# Patient Record
Sex: Female | Born: 1945 | Race: White | Hispanic: No | Smoking: Never smoker
Health system: Southern US, Community
[De-identification: ages and names within clinical notes are randomized; demographics above are authoritative.]

## PROBLEM LIST (undated history)

## (undated) ENCOUNTER — Ambulatory Visit: Admission: EM | Payer: Medicare PPO | Source: Home / Self Care

## (undated) DIAGNOSIS — M479 Spondylosis, unspecified: Secondary | ICD-10-CM

## (undated) DIAGNOSIS — J45909 Unspecified asthma, uncomplicated: Secondary | ICD-10-CM

## (undated) DIAGNOSIS — D509 Iron deficiency anemia, unspecified: Secondary | ICD-10-CM

## (undated) DIAGNOSIS — D649 Anemia, unspecified: Secondary | ICD-10-CM

## (undated) DIAGNOSIS — D126 Benign neoplasm of colon, unspecified: Secondary | ICD-10-CM

## (undated) DIAGNOSIS — E785 Hyperlipidemia, unspecified: Secondary | ICD-10-CM

## (undated) DIAGNOSIS — C801 Malignant (primary) neoplasm, unspecified: Secondary | ICD-10-CM

## (undated) HISTORY — PX: BACK SURGERY: SHX140

## (undated) HISTORY — PX: CATARACT EXTRACTION, BILATERAL: SHX1313

## (undated) HISTORY — PX: TUBAL LIGATION: SHX77

## (undated) HISTORY — PX: THUMB ARTHROSCOPY: SHX2509

## (undated) HISTORY — PX: EYE SURGERY: SHX253

---

## 2004-12-04 ENCOUNTER — Ambulatory Visit: Payer: Self-pay

## 2005-12-27 ENCOUNTER — Ambulatory Visit: Payer: Self-pay

## 2006-01-02 ENCOUNTER — Ambulatory Visit: Payer: Self-pay

## 2006-06-21 ENCOUNTER — Ambulatory Visit: Payer: Self-pay

## 2006-07-08 ENCOUNTER — Ambulatory Visit: Payer: Self-pay | Admitting: Gastroenterology

## 2006-08-13 ENCOUNTER — Ambulatory Visit: Payer: Self-pay | Admitting: Unknown Physician Specialty

## 2006-12-24 ENCOUNTER — Ambulatory Visit: Payer: Self-pay

## 2008-01-01 ENCOUNTER — Ambulatory Visit: Payer: Self-pay

## 2009-01-24 ENCOUNTER — Ambulatory Visit: Payer: Self-pay

## 2009-02-07 ENCOUNTER — Ambulatory Visit: Payer: Self-pay

## 2009-03-28 ENCOUNTER — Ambulatory Visit: Payer: Self-pay | Admitting: Family Medicine

## 2009-07-23 ENCOUNTER — Ambulatory Visit: Payer: Self-pay | Admitting: Internal Medicine

## 2010-02-07 ENCOUNTER — Ambulatory Visit: Payer: Self-pay

## 2011-03-13 ENCOUNTER — Ambulatory Visit: Payer: Self-pay

## 2012-01-14 ENCOUNTER — Ambulatory Visit: Payer: Self-pay | Admitting: Gastroenterology

## 2012-01-15 LAB — PATHOLOGY REPORT

## 2014-10-18 ENCOUNTER — Encounter (INDEPENDENT_AMBULATORY_CARE_PROVIDER_SITE_OTHER): Payer: Medicare (Managed Care) | Admitting: Ophthalmology

## 2014-10-18 DIAGNOSIS — H43813 Vitreous degeneration, bilateral: Secondary | ICD-10-CM | POA: Diagnosis not present

## 2014-10-18 DIAGNOSIS — H33301 Unspecified retinal break, right eye: Secondary | ICD-10-CM

## 2014-10-27 ENCOUNTER — Encounter (INDEPENDENT_AMBULATORY_CARE_PROVIDER_SITE_OTHER): Payer: Self-pay | Admitting: Ophthalmology

## 2014-11-03 ENCOUNTER — Ambulatory Visit (INDEPENDENT_AMBULATORY_CARE_PROVIDER_SITE_OTHER): Payer: Medicare (Managed Care) | Admitting: Ophthalmology

## 2014-11-03 DIAGNOSIS — H33301 Unspecified retinal break, right eye: Secondary | ICD-10-CM

## 2014-11-08 ENCOUNTER — Encounter (INDEPENDENT_AMBULATORY_CARE_PROVIDER_SITE_OTHER): Payer: Medicare (Managed Care) | Admitting: Ophthalmology

## 2014-11-08 DIAGNOSIS — H33301 Unspecified retinal break, right eye: Secondary | ICD-10-CM

## 2014-11-11 ENCOUNTER — Encounter (INDEPENDENT_AMBULATORY_CARE_PROVIDER_SITE_OTHER): Payer: Medicare (Managed Care) | Admitting: Ophthalmology

## 2015-03-07 ENCOUNTER — Ambulatory Visit (INDEPENDENT_AMBULATORY_CARE_PROVIDER_SITE_OTHER): Payer: Medicare (Managed Care) | Admitting: Ophthalmology

## 2015-03-07 DIAGNOSIS — H43813 Vitreous degeneration, bilateral: Secondary | ICD-10-CM

## 2015-03-07 DIAGNOSIS — H33301 Unspecified retinal break, right eye: Secondary | ICD-10-CM | POA: Diagnosis not present

## 2016-03-20 ENCOUNTER — Other Ambulatory Visit: Payer: Self-pay | Admitting: Family Medicine

## 2016-03-20 ENCOUNTER — Other Ambulatory Visit: Payer: Self-pay | Admitting: Certified Nurse Midwife

## 2016-03-20 DIAGNOSIS — Z1231 Encounter for screening mammogram for malignant neoplasm of breast: Secondary | ICD-10-CM

## 2016-04-03 ENCOUNTER — Encounter: Payer: Self-pay | Admitting: Radiology

## 2016-04-03 ENCOUNTER — Ambulatory Visit
Admission: RE | Admit: 2016-04-03 | Discharge: 2016-04-03 | Disposition: A | Discharge status: Home / Self Care | Payer: Medicare Other | Source: Ambulatory Visit | Attending: Family Medicine | Admitting: Family Medicine

## 2016-04-03 DIAGNOSIS — Z1231 Encounter for screening mammogram for malignant neoplasm of breast: Secondary | ICD-10-CM | POA: Insufficient documentation

## 2016-04-03 DIAGNOSIS — R928 Other abnormal and inconclusive findings on diagnostic imaging of breast: Secondary | ICD-10-CM | POA: Insufficient documentation

## 2016-04-03 HISTORY — DX: Malignant (primary) neoplasm, unspecified: C80.1

## 2016-04-06 ENCOUNTER — Inpatient Hospital Stay
Admission: RE | Admit: 2016-04-06 | Discharge: 2016-04-06 | Disposition: A | Discharge status: Home / Self Care | Payer: Self-pay | Source: Ambulatory Visit | Attending: *Deleted | Admitting: *Deleted

## 2016-04-06 ENCOUNTER — Other Ambulatory Visit: Payer: Self-pay | Admitting: *Deleted

## 2016-04-06 DIAGNOSIS — Z9289 Personal history of other medical treatment: Secondary | ICD-10-CM

## 2016-04-11 ENCOUNTER — Other Ambulatory Visit: Payer: Self-pay | Admitting: Family Medicine

## 2016-04-11 DIAGNOSIS — N6320 Unspecified lump in the left breast, unspecified quadrant: Secondary | ICD-10-CM

## 2016-04-17 ENCOUNTER — Ambulatory Visit
Admission: RE | Admit: 2016-04-17 | Discharge: 2016-04-17 | Disposition: A | Discharge status: Home / Self Care | Payer: Medicare Other | Source: Ambulatory Visit | Attending: Family Medicine | Admitting: Family Medicine

## 2016-04-17 DIAGNOSIS — N6320 Unspecified lump in the left breast, unspecified quadrant: Secondary | ICD-10-CM

## 2016-04-24 ENCOUNTER — Other Ambulatory Visit: Payer: Self-pay | Admitting: Family Medicine

## 2016-04-24 DIAGNOSIS — I609 Nontraumatic subarachnoid hemorrhage, unspecified: Secondary | ICD-10-CM

## 2016-04-26 ENCOUNTER — Ambulatory Visit: Payer: Medicare Other

## 2016-04-26 ENCOUNTER — Other Ambulatory Visit: Payer: Medicare Other

## 2016-05-14 ENCOUNTER — Ambulatory Visit
Admission: RE | Admit: 2016-05-14 | Discharge: 2016-05-14 | Disposition: A | Discharge status: Home / Self Care | Payer: Medicare Other | Source: Ambulatory Visit | Attending: Family Medicine | Admitting: Family Medicine

## 2016-05-14 DIAGNOSIS — Z09 Encounter for follow-up examination after completed treatment for conditions other than malignant neoplasm: Secondary | ICD-10-CM | POA: Insufficient documentation

## 2016-05-14 DIAGNOSIS — Z8679 Personal history of other diseases of the circulatory system: Secondary | ICD-10-CM | POA: Diagnosis not present

## 2016-05-14 DIAGNOSIS — I609 Nontraumatic subarachnoid hemorrhage, unspecified: Secondary | ICD-10-CM

## 2017-03-20 ENCOUNTER — Ambulatory Visit
Admission: EM | Admit: 2017-03-20 | Discharge: 2017-03-20 | Disposition: A | Discharge status: Home / Self Care | Payer: Medicare Other | Attending: Family Medicine | Admitting: Family Medicine

## 2017-03-20 DIAGNOSIS — A09 Infectious gastroenteritis and colitis, unspecified: Secondary | ICD-10-CM

## 2017-03-20 HISTORY — DX: Hyperlipidemia, unspecified: E78.5

## 2017-03-20 LAB — CBC WITH DIFFERENTIAL/PLATELET
Basophils Absolute: 0 10*3/uL (ref 0–0.1)
Basophils Relative: 1 %
EOS ABS: 0.1 10*3/uL (ref 0–0.7)
EOS PCT: 2 %
HCT: 35.4 % (ref 35.0–47.0)
Hemoglobin: 12 g/dL (ref 12.0–16.0)
LYMPHS PCT: 40 %
Lymphs Abs: 1.4 10*3/uL (ref 1.0–3.6)
MCH: 28.8 pg (ref 26.0–34.0)
MCHC: 34 g/dL (ref 32.0–36.0)
MCV: 84.7 fL (ref 80.0–100.0)
Monocytes Absolute: 0.4 10*3/uL (ref 0.2–0.9)
Monocytes Relative: 12 %
Neutro Abs: 1.7 10*3/uL (ref 1.4–6.5)
Neutrophils Relative %: 47 %
PLATELETS: 212 10*3/uL (ref 150–440)
RBC: 4.18 MIL/uL (ref 3.80–5.20)
RDW: 13.9 % (ref 11.5–14.5)
WBC: 3.7 10*3/uL (ref 3.6–11.0)

## 2017-03-20 LAB — COMPREHENSIVE METABOLIC PANEL
ALK PHOS: 52 U/L (ref 38–126)
ALT: 17 U/L (ref 14–54)
AST: 22 U/L (ref 15–41)
Albumin: 4 g/dL (ref 3.5–5.0)
Anion gap: 7 (ref 5–15)
BILIRUBIN TOTAL: 0.7 mg/dL (ref 0.3–1.2)
CO2: 26 mmol/L (ref 22–32)
Calcium: 8.9 mg/dL (ref 8.9–10.3)
Chloride: 105 mmol/L (ref 101–111)
Creatinine, Ser: 0.67 mg/dL (ref 0.44–1.00)
Glucose, Bld: 118 mg/dL — ABNORMAL HIGH (ref 65–99)
POTASSIUM: 3.6 mmol/L (ref 3.5–5.1)
Sodium: 138 mmol/L (ref 135–145)
TOTAL PROTEIN: 7 g/dL (ref 6.5–8.1)

## 2017-03-20 MED ORDER — VANCOMYCIN HCL 125 MG PO CAPS: 125.0000 mg | ORAL_CAPSULE | Freq: Four times a day (QID) | ORAL | 0 refills | Status: DC

## 2017-03-20 NOTE — ED Triage Notes (Signed)
Patient complains of diarrhea after 2 rounds of antibiotics. Patient states that she was originally placed on Bactrim for 7 days for a puncture wound and then was placed on Clindamycin for additional 10 days. Patient states that her stools over the last 3-4 days ago. Patient states that she has had loose stools that are waking her up at night. Patient states that everytime she urinates she will also have diarrhea. Patient states that she has currently already went 4 times today. Patient states that she believes she is having greater than 10 BMs daily. Patient reports that she is still able to eat and keep things down.

## 2017-03-20 NOTE — ED Provider Notes (Signed)
[facility]    CSN: [id number] Arrival date & time: 03/20/17  2025     History   Chief Complaint Chief Complaint  Patient presents with  . Diarrhea    HPI Holly Soto is a 71 y.o. female.   71 yo female with a c/o mucousy diarrhea for the past 3-4 days with about 10 bowel movements per day. Denies abdominal pain, cramping, fevers, chills, melena, hematochezia, vomiting. States symptoms began after finishing 2 rounds of antibiotics (Bactrim and Clindamycin) for a puncture wound.    The history is provided by the patient.  Diarrhea    Past Medical History:  Diagnosis Date  . Cancer ([place])    skin ca on back  . Hyperlipidemia     There are no active problems to display for this patient.   Past Surgical History:  Procedure Laterality Date  . BACK SURGERY    . CATARACT EXTRACTION, BILATERAL    . THUMB ARTHROSCOPY Right     OB History    No data available       Home Medications    Prior to Admission medications   Medication Sig Start Date End Date Taking? Authorizing Provider  atorvastatin (LIPITOR) 10 MG tablet Take 10 mg by mouth daily.   Yes [provider]  Calcium Carb-Cholecalciferol (CALTRATE 600+D) 600-800 MG-UNIT TABS Take by mouth.   Yes [provider]  cholecalciferol (VITAMIN D) 1000 units tablet Take 1,000 Units by mouth daily.   Yes [provider]  Multiple Vitamin (MULTIVITAMIN) capsule Take 1 capsule by mouth daily.   Yes [provider]  Multiple Vitamins-Minerals (PRESERVISION AREDS 2+MULTI VIT PO) Take by mouth.   Yes [provider]  vancomycin (VANCOCIN) 125 MG capsule Take 1 capsule (125 mg total) by mouth 4 (four) times daily. 03/20/17   Norval Gable, MD    Family History Family History  Problem Relation Age of Onset  . Breast cancer Sister 32    Social History Social History  Substance Use Topics  . Smoking status: Never Smoker  . Smokeless tobacco: Never Used  .  Alcohol use No     Allergies   Sulfa antibiotics   Review of Systems Review of Systems  Gastrointestinal: Positive for diarrhea.     Physical Exam Triage Vital Signs ED Triage Vitals  Enc Vitals Group     BP 03/20/17 0844 (!) 144/64     Pulse Rate 03/20/17 0844 85     Resp 03/20/17 0844 18     Temp 03/20/17 0844 98.2 F (36.8 C)     Temp Source 03/20/17 0844 Oral     SpO2 03/20/17 0844 100 %     Weight 03/20/17 0842 148 lb (67.1 kg)     Height 03/20/17 0842 5' 5.5" (1.664 m)     Head Circumference --      Peak Flow --      Pain Score 03/20/17 0842 3     Pain Loc --      Pain Edu? --      Excl. in [place]? --    No data found.   Updated Vital Signs BP (!) 144/64 (BP Location: Left Arm)   Pulse 85   Temp 98.2 F (36.8 C) (Oral)   Resp 18   Ht 5' 5.5" (1.664 m)   Wt 148 lb (67.1 kg)   SpO2 100%   BMI 24.25 kg/m   Visual Acuity Right Eye Distance:   Left Eye Distance:  Bilateral Distance:    Right Eye Near:   Left Eye Near:    Bilateral Near:     Physical Exam  Constitutional: She appears well-developed and well-nourished. No distress.  Abdominal: Soft. Bowel sounds are normal. She exhibits no distension and no mass. There is no tenderness. There is no rebound and no guarding.  Skin: She is not diaphoretic.  Nursing note and vitals reviewed.    UC Treatments / Results  Labs (all labs ordered are listed, but only abnormal results are displayed) Labs Reviewed  COMPREHENSIVE METABOLIC PANEL - Abnormal; Notable for the following:       Result Value   Glucose, Bld 118 (*)    BUN <5 (*)    All other components within normal limits  CBC WITH DIFFERENTIAL/PLATELET    EKG  EKG Interpretation None       Radiology No results found.  Procedures Procedures (including critical care time)  Medications Ordered in UC Medications - No data to display   Initial Impression / Assessment and Plan / UC Course  I have reviewed the triage vital signs and  the nursing notes.  Pertinent labs & imaging results that were available during my care of the patient were reviewed by me and considered in my medical decision making (see chart for details).       Final Clinical Impressions(s) / UC Diagnoses   Final diagnoses:  Diarrhea of infectious origin  (possible/likely C. Diff)   New Prescriptions Discharge Medication List as of 03/20/2017  9:53 AM    START taking these medications   Details  vancomycin (VANCOCIN) 125 MG capsule Take 1 capsule (125 mg total) by mouth 4 (four) times daily., Starting Wed 03/20/2017, Normal       1. Lab results and diagnosis reviewed with patient 2. rx as per orders above; reviewed possible side effects, interactions, risks and benefits  3. Recommend supportive treatment with increased fluids/clear liquids, then advance diet slowly as tolerated 4. Follow-up prn if symptoms worsen or don't improve   Controlled Substance Prescriptions [place] Controlled Substance Registry consulted? Not Applicable   Norval Gable, MD 03/20/17 [phone]

## 2017-04-08 ENCOUNTER — Other Ambulatory Visit: Payer: Self-pay | Admitting: Pediatrics

## 2017-04-08 DIAGNOSIS — Z1231 Encounter for screening mammogram for malignant neoplasm of breast: Secondary | ICD-10-CM

## 2017-06-11 ENCOUNTER — Ambulatory Visit
Admission: RE | Admit: 2017-06-11 | Discharge: 2017-06-11 | Disposition: A | Discharge status: Home / Self Care | Payer: Medicare Other | Source: Ambulatory Visit | Attending: Pediatrics | Admitting: Pediatrics

## 2017-06-11 DIAGNOSIS — Z1231 Encounter for screening mammogram for malignant neoplasm of breast: Secondary | ICD-10-CM

## 2017-06-18 ENCOUNTER — Ambulatory Visit: Admission: RE | Admit: 2017-06-18 | Payer: Medicare Other | Source: Ambulatory Visit | Admitting: Gastroenterology

## 2017-06-18 ENCOUNTER — Encounter: Admission: RE | Payer: Self-pay | Source: Ambulatory Visit

## 2017-06-18 SURGERY — COLONOSCOPY WITH PROPOFOL
Anesthesia: General

## 2017-08-28 ENCOUNTER — Encounter: Payer: Self-pay | Admitting: *Deleted

## 2017-08-29 ENCOUNTER — Encounter
Admission: RE | Disposition: A | Discharge status: Home / Self Care | Payer: Self-pay | Source: Ambulatory Visit | Attending: Gastroenterology

## 2017-08-29 ENCOUNTER — Ambulatory Visit: Payer: Medicare Other | Admitting: Certified Registered"

## 2017-08-29 ENCOUNTER — Ambulatory Visit
Admission: RE | Admit: 2017-08-29 | Discharge: 2017-08-29 | Disposition: A | Discharge status: Home / Self Care | Payer: Medicare Other | Source: Ambulatory Visit | Attending: Gastroenterology | Admitting: Gastroenterology

## 2017-08-29 ENCOUNTER — Encounter: Payer: Self-pay | Admitting: *Deleted

## 2017-08-29 DIAGNOSIS — Z86010 Personal history of colonic polyps: Secondary | ICD-10-CM | POA: Diagnosis not present

## 2017-08-29 DIAGNOSIS — D128 Benign neoplasm of rectum: Secondary | ICD-10-CM | POA: Diagnosis not present

## 2017-08-29 DIAGNOSIS — Z882 Allergy status to sulfonamides status: Secondary | ICD-10-CM | POA: Diagnosis not present

## 2017-08-29 DIAGNOSIS — D124 Benign neoplasm of descending colon: Secondary | ICD-10-CM | POA: Insufficient documentation

## 2017-08-29 DIAGNOSIS — Z1211 Encounter for screening for malignant neoplasm of colon: Secondary | ICD-10-CM | POA: Insufficient documentation

## 2017-08-29 DIAGNOSIS — D125 Benign neoplasm of sigmoid colon: Secondary | ICD-10-CM | POA: Diagnosis not present

## 2017-08-29 DIAGNOSIS — K5730 Diverticulosis of large intestine without perforation or abscess without bleeding: Secondary | ICD-10-CM | POA: Insufficient documentation

## 2017-08-29 DIAGNOSIS — Z7982 Long term (current) use of aspirin: Secondary | ICD-10-CM | POA: Insufficient documentation

## 2017-08-29 DIAGNOSIS — Z79899 Other long term (current) drug therapy: Secondary | ICD-10-CM | POA: Diagnosis not present

## 2017-08-29 DIAGNOSIS — Z881 Allergy status to other antibiotic agents status: Secondary | ICD-10-CM | POA: Diagnosis not present

## 2017-08-29 HISTORY — DX: Anemia, unspecified: D64.9

## 2017-08-29 HISTORY — DX: Unspecified asthma, uncomplicated: J45.909

## 2017-08-29 HISTORY — PX: COLONOSCOPY WITH PROPOFOL: SHX5780

## 2017-08-29 HISTORY — DX: Spondylosis, unspecified: M47.9

## 2017-08-29 SURGERY — COLONOSCOPY WITH PROPOFOL
Anesthesia: General

## 2017-08-29 MED ORDER — LIDOCAINE HCL (PF) 2 % IJ SOLN
INTRAMUSCULAR | Status: AC
  Filled 2017-08-29: qty 10

## 2017-08-29 MED ORDER — PROPOFOL 10 MG/ML IV BOLUS
INTRAVENOUS | Status: AC
  Filled 2017-08-29: qty 20

## 2017-08-29 MED ORDER — SODIUM CHLORIDE 0.9 % IV SOLN
INTRAVENOUS | Status: DC
  Administered 2017-08-29: 10:00:00 via INTRAVENOUS

## 2017-08-29 MED ORDER — PROPOFOL 10 MG/ML IV BOLUS
INTRAVENOUS | Status: DC | PRN
  Administered 2017-08-29: 50 mg via INTRAVENOUS
  Administered 2017-08-29: 100 mg via INTRAVENOUS

## 2017-08-29 MED ORDER — PROPOFOL 500 MG/50ML IV EMUL
INTRAVENOUS | Status: DC | PRN
  Administered 2017-08-29: 100 ug/kg/min via INTRAVENOUS

## 2017-08-29 MED ORDER — SODIUM CHLORIDE 0.9 % IV SOLN: INTRAVENOUS | Status: DC

## 2017-08-29 MED ORDER — ALBUTEROL SULFATE HFA 108 (90 BASE) MCG/ACT IN AERS
INHALATION_SPRAY | RESPIRATORY_TRACT | Status: AC
  Filled 2017-08-29: qty 6.7

## 2017-08-29 MED ORDER — LIDOCAINE HCL (CARDIAC) 20 MG/ML IV SOLN
INTRAVENOUS | Status: DC | PRN
  Administered 2017-08-29: 60 mg via INTRATRACHEAL

## 2017-08-29 MED ORDER — PROPOFOL 10 MG/ML IV BOLUS
INTRAVENOUS | Status: AC
  Filled 2017-08-29: qty 40

## 2017-08-29 NOTE — Anesthesia Postprocedure Evaluation (Signed)
Anesthesia Post Note  Patient: Holly Soto  Procedure(s) Performed: COLONOSCOPY WITH PROPOFOL (N/A )  Patient location during evaluation: Endoscopy Anesthesia Type: General Level of consciousness: awake and alert Pain management: pain level controlled Vital Signs Assessment: post-procedure vital signs reviewed and stable Respiratory status: spontaneous breathing and respiratory function stable Cardiovascular status: stable Anesthetic complications: no     Last Vitals:  Vitals:   08/29/17 1119 08/29/17 1131  BP: 113/67 137/78  Pulse: 78 68  Resp: 15 15  Temp:    SpO2: 98% 100%    Last Pain:  Vitals:   08/29/17 1111  TempSrc: Tympanic                 Varina Hulon K

## 2017-08-29 NOTE — Anesthesia Postprocedure Evaluation (Signed)
Anesthesia Post Note  Patient: Holly Soto  Procedure(s) Performed: COLONOSCOPY WITH PROPOFOL (N/A )  Patient location during evaluation: Endoscopy Anesthesia Type: General Level of consciousness: awake and alert, patient cooperative and oriented Pain management: satisfactory to patient Vital Signs Assessment: post-procedure vital signs reviewed and stable Respiratory status: spontaneous breathing and respiratory function stable Cardiovascular status: blood pressure returned to baseline and stable Postop Assessment: no headache, no backache, no apparent nausea or vomiting, patient able to bend at knees and adequate PO intake Anesthetic complications: no     Last Vitals:  Vitals:   08/29/17 1109 08/29/17 1111  BP: (!) 112/57 (!) 112/57  Pulse: 73 72  Resp: (!) 21 17  Temp: (!) 36.1 C (!) 36 C  SpO2: 100% 99%    Last Pain:  Vitals:   08/29/17 1111  TempSrc: Tympanic                 Holly Soto

## 2017-08-29 NOTE — Anesthesia Preprocedure Evaluation (Signed)
Anesthesia Evaluation  Patient identified by MRN, date of birth, ID band Patient awake    Reviewed: Allergy & Precautions, NPO status , Patient's Chart, lab work & pertinent test results  History of Anesthesia Complications Negative for: history of anesthetic complications  Airway Mallampati: II       Dental   Pulmonary asthma (as a child, no problems in 60 yrs) , neg sleep apnea, neg COPD,           Cardiovascular (-) hypertension(-) Past MI and (-) CHF (-) dysrhythmias (-) Valvular Problems/Murmurs     Neuro/Psych neg Seizures    GI/Hepatic Neg liver ROS, neg GERD  ,  Endo/Other  neg diabetes  Renal/GU negative Renal ROS     Musculoskeletal   Abdominal   Peds  Hematology  (+) anemia ,   Anesthesia Other Findings   Reproductive/Obstetrics                             Anesthesia Physical Anesthesia Plan  ASA: II  Anesthesia Plan:    Post-op Pain Management:    Induction:   PONV Risk Score and Plan: 2 and TIVA and Propofol infusion  Airway Management Planned: Simple Face Mask  Additional Equipment:   Intra-op Plan:   Post-operative Plan:   Informed Consent: I have reviewed the patients History and Physical, chart, labs and discussed the procedure including the risks, benefits and alternatives for the proposed anesthesia with the patient or authorized representative who has indicated his/her understanding and acceptance.     Plan Discussed with:   Anesthesia Plan Comments:         Anesthesia Quick Evaluation

## 2017-08-29 NOTE — Anesthesia Post-op Follow-up Note (Signed)
Anesthesia QCDR form completed.        

## 2017-08-29 NOTE — Transfer of Care (Signed)
Immediate Anesthesia Transfer of Care Note  Patient: Holly Soto  Procedure(s) Performed: COLONOSCOPY WITH PROPOFOL (N/A )  Patient Location: PACU  Anesthesia Type:General  Level of Consciousness: awake, alert  and patient cooperative  Airway & Oxygen Therapy: Patient Spontanous Breathing  Post-op Assessment: Report given to RN, Post -op Vital signs reviewed and stable and Patient moving all extremities X 4  Post vital signs: Reviewed and stable  Last Vitals:  Vitals:   08/29/17 1109 08/29/17 1111  BP: (!) 112/57 (!) 112/57  Pulse: 73 72  Resp: (!) 21 17  Temp: (!) 36.1 C (!) 36 C  SpO2: 100% 99%    Last Pain:  Vitals:   08/29/17 1111  TempSrc: Tympanic         Complications: No apparent anesthesia complications

## 2017-08-29 NOTE — Op Note (Signed)
[facility] Gastroenterology Patient Name: Hong Timm Procedure Date: 08/29/2017 10:33 AM MRN: [id number] Account #: [id number] Date of Birth: [birth year] Admit Type: Outpatient Age: 72 Room: [facility] ENDO ROOM 1 Gender: Female Note Status: Finalized Procedure:            Colonoscopy Indications:          Personal history of colonic polyps Providers:            Lollie Sails, MD Referring MD:         Ane Payment, MD (Referring MD) Medicines:            Monitored Anesthesia Care Complications:        No immediate complications. Procedure:            Pre-Anesthesia Assessment:                       - ASA Grade Assessment: II - A patient with mild                        systemic disease.                       After obtaining informed consent, the colonoscope was                        passed under direct vision. Throughout the procedure,                        the patient's blood pressure, pulse, and oxygen                        saturations were monitored continuously. The                        Colonoscope was introduced through the anus and                        advanced to the the cecum, identified by appendiceal                        orifice and ileocecal valve. The colonoscopy was                        performed without difficulty. The patient tolerated the                        procedure well. The quality of the bowel preparation                        was good. Findings:      Multiple small-mouthed diverticula were found in the sigmoid colon and       descending colon.      A 4 mm polyp was found in the descending colon. The polyp was sessile.       The polyp was removed with a cold biopsy forceps. Resection and       retrieval were complete.      Three sessile polyps were found in the sigmoid colon. The polyps were       less than 1 mm in size. These polyps were removed with a cold biopsy  forceps. Resection and retrieval were complete.    A 2 mm polyp was found in the rectum. The polyp was sessile. The polyp       was removed with a cold biopsy forceps. Resection and retrieval were       complete.      The digital rectal exam was normal. Impression:           - Diverticulosis in the sigmoid colon and in the                        descending colon.                       - One 4 mm polyp in the descending colon, removed with                        a cold biopsy forceps. Resected and retrieved.                       - Three less than 1 mm polyps in the sigmoid colon,                        removed with a cold biopsy forceps. Resected and                        retrieved.                       - One 2 mm polyp in the rectum, removed with a cold                        biopsy forceps. Resected and retrieved. Recommendation:       - Discharge patient to home.                       - Telephone GI clinic for pathology results in 1 week. Procedure Code(s):    --- Professional ---                       [phone], Colonoscopy, flexible; with biopsy, single or                        multiple Diagnosis Code(s):    --- Professional ---                       D12.4, Benign neoplasm of descending colon                       K62.1, Rectal polyp                       D12.5, Benign neoplasm of sigmoid colon                       Z86.010, Personal history of colonic polyps                       K57.30, Diverticulosis of large intestine without                        perforation or abscess without bleeding CPT copyright 2016 American Medical  Association. All rights reserved. The codes documented in this report are preliminary and upon coder review may  be revised to meet current compliance requirements. Lollie Sails, MD 08/29/2017 11:06:46 AM This report has been signed electronically. Number of Addenda: 0 Note Initiated On: 08/29/2017 10:33 AM Scope Withdrawal Time: 0 hours 9 minutes 38 seconds  Total Procedure Duration: 0 hours 25  minutes 15 seconds       [facility]

## 2017-08-29 NOTE — H&P (Signed)
Outpatient short stay form Pre-procedure 08/29/2017 10:30 AM Lollie Sails MD  Primary Physician: Dr. Barbaraann Boys  Reason for visit: Colonoscopy  History of present illness: Patient is a 72 year old female presenting today as above.  She has personal history of adenomatous colon polyp removed on colonoscopy about 5 years ago.  She also has a family history of several secondary relatives with possible colon cancer although that is uncertain.  He does take 81 mg aspirin that was held today.  She takes no other aspirin products or blood thinning agents.  Tolerated her prep well.    Current Facility-Administered Medications:  .  0.9 %  sodium chloride infusion, , Intravenous, Continuous, Lollie Sails, MD, Last Rate: 20 mL/hr at 08/29/17 0957 .  0.9 %  sodium chloride infusion, , Intravenous, Continuous, Lollie Sails, MD  Medications Prior to Admission  Medication Sig Dispense Refill Last Dose  . aspirin EC 81 MG tablet Take 81 mg by mouth daily.   08/24/2017  . atorvastatin (LIPITOR) 10 MG tablet Take 10 mg by mouth daily.   08/26/2017  . Calcium Carb-Cholecalciferol (CALTRATE 600+D) 600-800 MG-UNIT TABS Take by mouth.   08/24/2017  . cholecalciferol (VITAMIN D) 1000 units tablet Take 1,000 Units by mouth daily.   08/24/2017  . Multiple Vitamin (MULTIVITAMIN) capsule Take 1 capsule by mouth daily.   08/24/2017  . Multiple Vitamins-Minerals (PRESERVISION AREDS 2+MULTI VIT PO) Take by mouth.   08/24/2017     Allergies  Allergen Reactions  . Keflex [Cephalexin] Hives  . Sulfa Antibiotics Rash     Past Medical History:  Diagnosis Date  . Anemia    Iron Deficiency  . Asthma   . Cancer ([place])    skin ca on back  . Hyperlipidemia   . Spondylosis     Review of systems:      Physical Exam    Heart and lungs: Regular rate and rhythm without rub or gallop, lungs are bilaterally clear.    HEENT: Normocephalic atraumatic eyes are anicteric    Other:    Pertinant exam  for procedure: Soft nontender nondistended bowel sounds positive normoactive.    Planned proceedures: Colonoscopy and indicated procedures. I have discussed the risks benefits and complications of procedures to include not limited to bleeding, infection, perforation and the risk of sedation and the patient wishes to proceed.    Lollie Sails, MD Gastroenterology 08/29/2017  10:30 AM

## 2017-08-30 LAB — SURGICAL PATHOLOGY

## 2018-05-08 ENCOUNTER — Other Ambulatory Visit: Payer: Self-pay | Admitting: Pediatrics

## 2018-05-08 DIAGNOSIS — Z1231 Encounter for screening mammogram for malignant neoplasm of breast: Secondary | ICD-10-CM

## 2018-07-14 ENCOUNTER — Ambulatory Visit
Admission: RE | Admit: 2018-07-14 | Discharge: 2018-07-14 | Disposition: A | Discharge status: Home / Self Care | Payer: Medicare Other | Source: Ambulatory Visit | Attending: Pediatrics | Admitting: Pediatrics

## 2018-07-14 DIAGNOSIS — Z1231 Encounter for screening mammogram for malignant neoplasm of breast: Secondary | ICD-10-CM | POA: Diagnosis not present

## 2019-02-17 ENCOUNTER — Other Ambulatory Visit: Payer: Self-pay | Admitting: Pediatrics

## 2019-02-17 DIAGNOSIS — M8580 Other specified disorders of bone density and structure, unspecified site: Secondary | ICD-10-CM

## 2019-06-01 ENCOUNTER — Other Ambulatory Visit: Payer: Self-pay | Admitting: Pediatrics

## 2019-06-01 DIAGNOSIS — Z1231 Encounter for screening mammogram for malignant neoplasm of breast: Secondary | ICD-10-CM

## 2019-07-21 ENCOUNTER — Other Ambulatory Visit: Payer: Self-pay

## 2019-07-21 ENCOUNTER — Ambulatory Visit
Admission: RE | Admit: 2019-07-21 | Discharge: 2019-07-21 | Disposition: A | Discharge status: Home / Self Care | Payer: Medicare PPO | Source: Ambulatory Visit | Attending: Pediatrics | Admitting: Pediatrics

## 2019-07-21 DIAGNOSIS — Z1231 Encounter for screening mammogram for malignant neoplasm of breast: Secondary | ICD-10-CM | POA: Insufficient documentation

## 2019-08-05 ENCOUNTER — Ambulatory Visit: Payer: Medicare PPO

## 2019-08-14 ENCOUNTER — Ambulatory Visit: Payer: Medicare PPO

## 2019-08-26 ENCOUNTER — Ambulatory Visit: Payer: Medicare PPO

## 2020-06-07 ENCOUNTER — Other Ambulatory Visit: Payer: Self-pay | Admitting: Pediatrics

## 2020-06-07 DIAGNOSIS — Z1231 Encounter for screening mammogram for malignant neoplasm of breast: Secondary | ICD-10-CM

## 2020-08-17 ENCOUNTER — Ambulatory Visit: Payer: Medicare PPO

## 2020-09-27 ENCOUNTER — Other Ambulatory Visit: Payer: Self-pay

## 2020-09-27 ENCOUNTER — Ambulatory Visit
Admission: RE | Admit: 2020-09-27 | Discharge: 2020-09-27 | Disposition: A | Discharge status: Home / Self Care | Payer: Medicare PPO | Source: Ambulatory Visit | Attending: Pediatrics | Admitting: Pediatrics

## 2020-09-27 DIAGNOSIS — Z1231 Encounter for screening mammogram for malignant neoplasm of breast: Secondary | ICD-10-CM

## 2021-01-14 IMAGING — MG DIGITAL SCREENING BILAT W/ TOMO W/ CAD
8 series · 8 of 24 positions shown · non-contrast
Comparison: Previous exam(s).

CLINICAL DATA: Screening.

EXAM:
DIGITAL SCREENING BILATERAL MAMMOGRAM WITH TOMO AND CAD

[L MLO synth-2D]
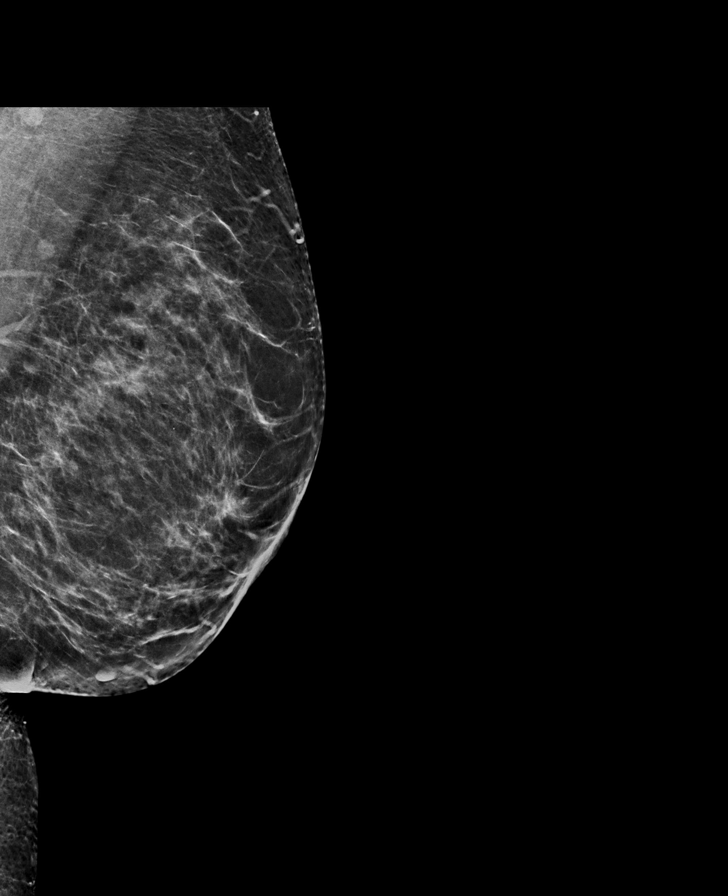

[R MLO synth-2D]
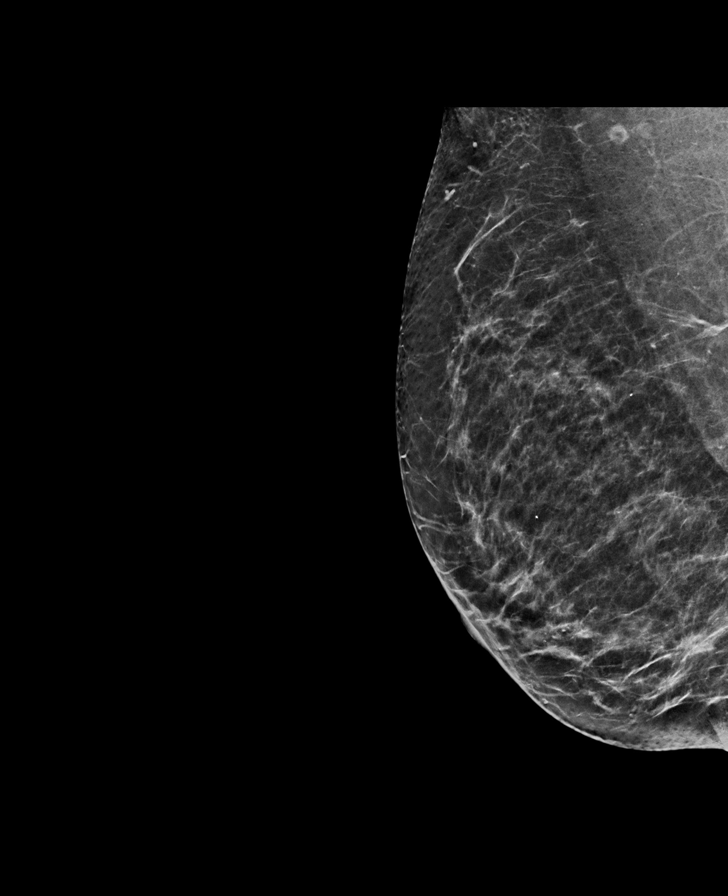

[L CC synth-2D]
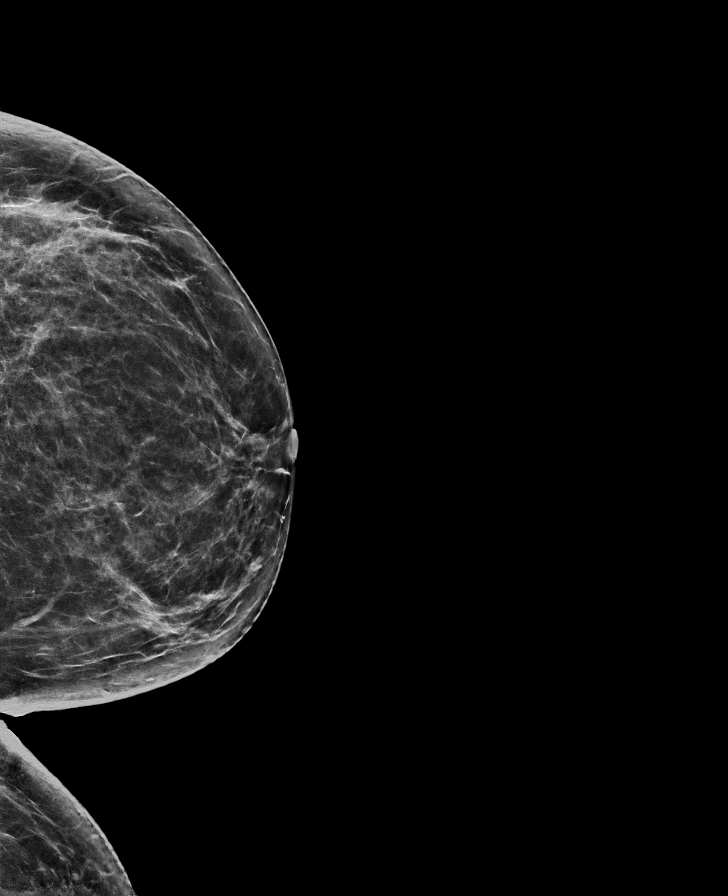

[R CC synth-2D]
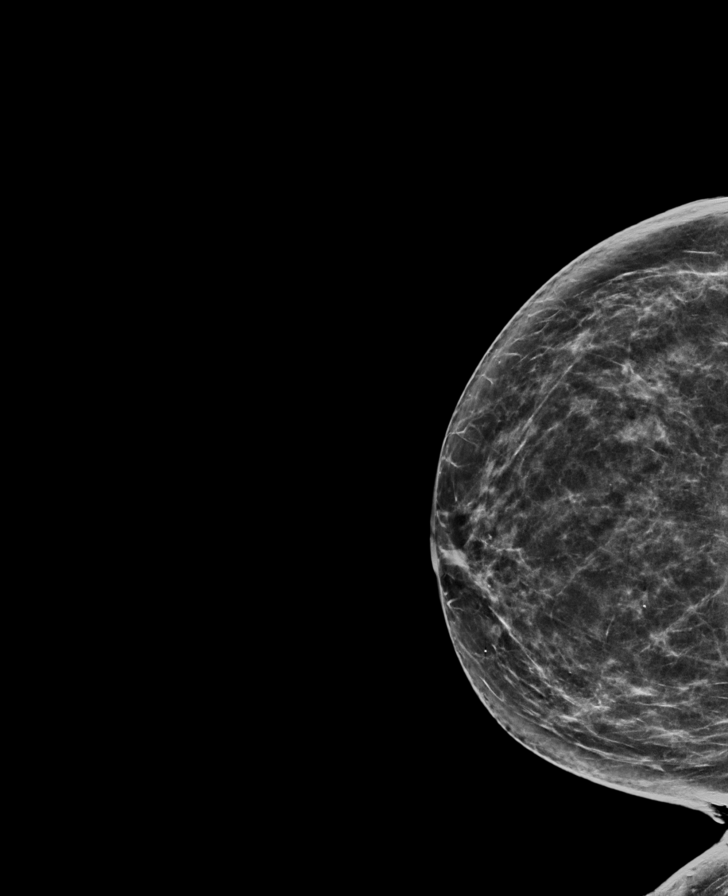

[L CC tomo · tomo slice 36/71.0]
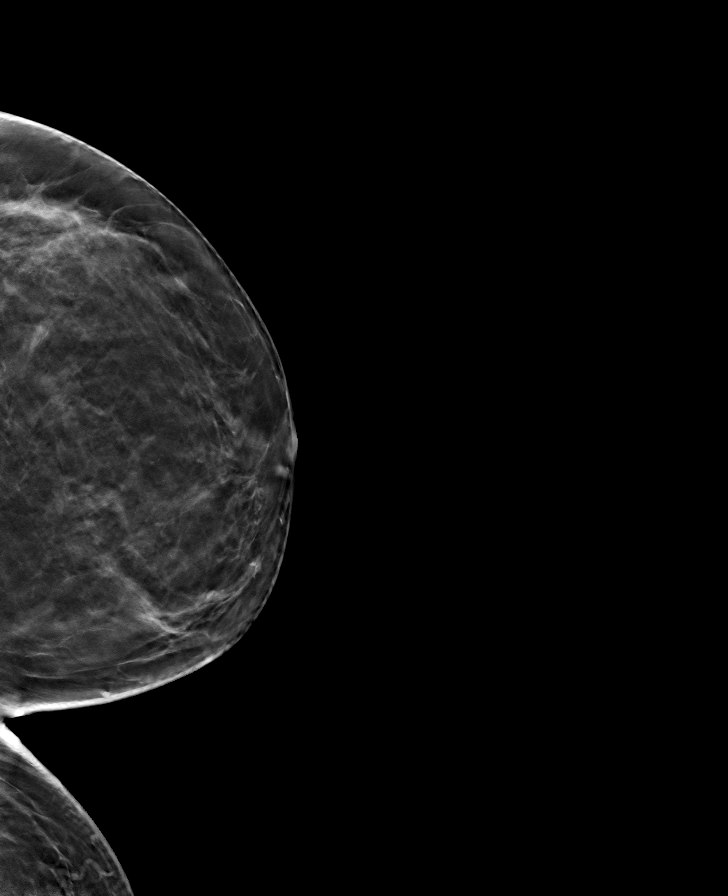

[R CC tomo · tomo slice 36/71.0]
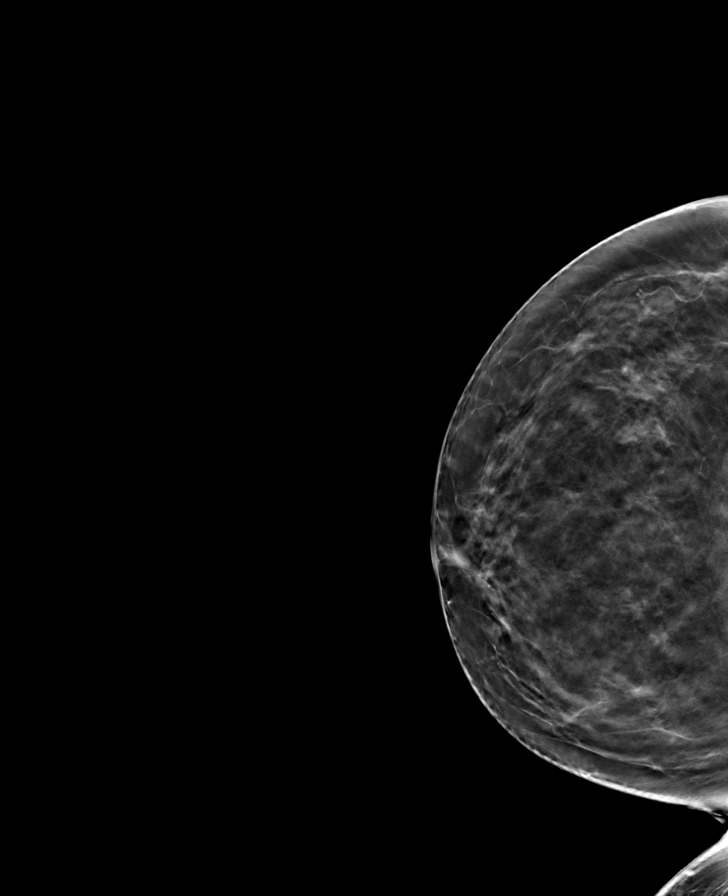

[R MLO tomo · tomo slice 34/67.0]
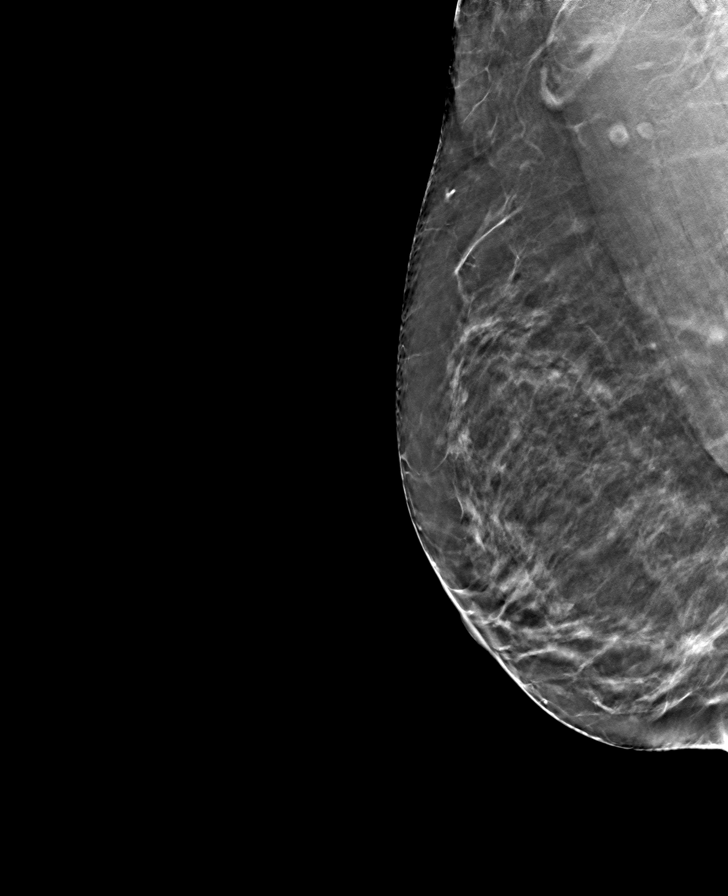

[L MLO tomo · tomo slice 37/74.0]
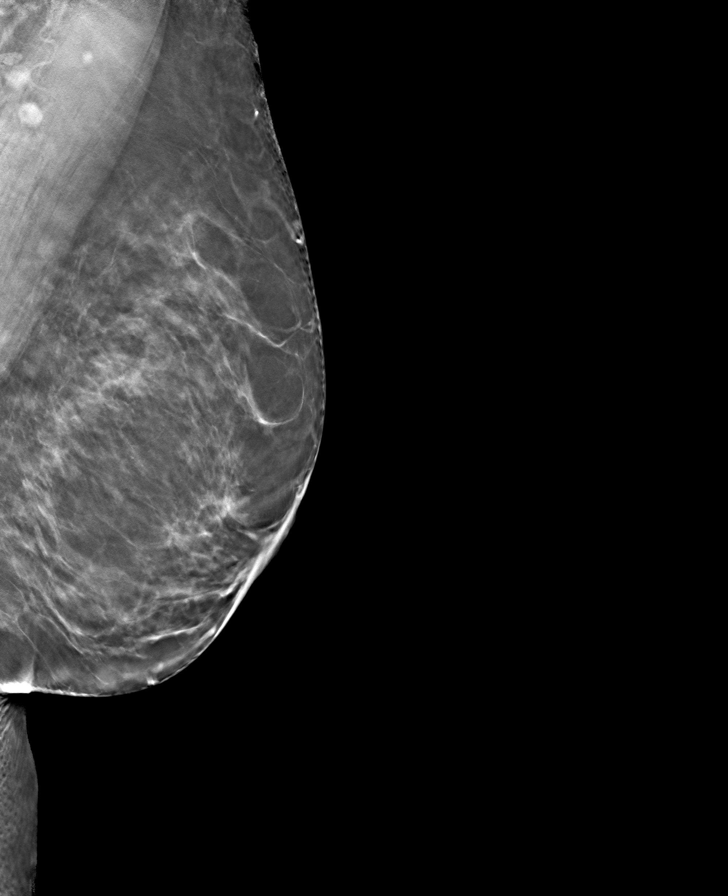

[8 of 24 positions shown; findings below may reference images not displayed]

ACR Breast Density Category b: There are scattered areas of
fibroglandular density.
FINDINGS: There are no findings suspicious for malignancy. Images were
processed with CAD.
IMPRESSION: No mammographic evidence of malignancy. A result letter of this
screening mammogram will be mailed directly to the patient.

RECOMMENDATION:
Screening mammogram in one year. (Code:CN-U-775)

BI-RADS CATEGORY  1: Negative.

## 2021-02-10 ENCOUNTER — Ambulatory Visit
Admission: RE | Admit: 2021-02-10 | Discharge: 2021-02-10 | Disposition: A | Discharge status: Home / Self Care | Payer: Medicare PPO | Source: Ambulatory Visit | Attending: Gastroenterology | Admitting: Gastroenterology

## 2021-02-10 ENCOUNTER — Ambulatory Visit: Payer: Medicare PPO | Admitting: Anesthesiology

## 2021-02-10 ENCOUNTER — Encounter
Admission: RE | Disposition: A | Discharge status: Home / Self Care | Payer: Self-pay | Source: Ambulatory Visit | Attending: Gastroenterology

## 2021-02-10 DIAGNOSIS — D123 Benign neoplasm of transverse colon: Secondary | ICD-10-CM | POA: Diagnosis not present

## 2021-02-10 DIAGNOSIS — K640 First degree hemorrhoids: Secondary | ICD-10-CM | POA: Diagnosis not present

## 2021-02-10 DIAGNOSIS — K5730 Diverticulosis of large intestine without perforation or abscess without bleeding: Secondary | ICD-10-CM | POA: Insufficient documentation

## 2021-02-10 DIAGNOSIS — D125 Benign neoplasm of sigmoid colon: Secondary | ICD-10-CM | POA: Diagnosis not present

## 2021-02-10 DIAGNOSIS — Z7982 Long term (current) use of aspirin: Secondary | ICD-10-CM | POA: Diagnosis not present

## 2021-02-10 DIAGNOSIS — Z1211 Encounter for screening for malignant neoplasm of colon: Secondary | ICD-10-CM | POA: Diagnosis present

## 2021-02-10 DIAGNOSIS — Z79899 Other long term (current) drug therapy: Secondary | ICD-10-CM | POA: Diagnosis not present

## 2021-02-10 DIAGNOSIS — Z882 Allergy status to sulfonamides status: Secondary | ICD-10-CM | POA: Insufficient documentation

## 2021-02-10 DIAGNOSIS — Z881 Allergy status to other antibiotic agents status: Secondary | ICD-10-CM | POA: Diagnosis not present

## 2021-02-10 HISTORY — DX: Benign neoplasm of colon, unspecified: D12.6

## 2021-02-10 HISTORY — DX: Iron deficiency anemia, unspecified: D50.9

## 2021-02-10 HISTORY — PX: COLONOSCOPY WITH PROPOFOL: SHX5780

## 2021-02-10 SURGERY — COLONOSCOPY WITH PROPOFOL
Anesthesia: General

## 2021-02-10 MED ORDER — PROPOFOL 500 MG/50ML IV EMUL
INTRAVENOUS | Status: AC
  Filled 2021-02-10: qty 50

## 2021-02-10 MED ORDER — PROPOFOL 10 MG/ML IV BOLUS
INTRAVENOUS | Status: DC | PRN
Start: 2021-02-10 — End: 2021-02-10
  Administered 2021-02-10: 75 mg via INTRAVENOUS
  Administered 2021-02-10: 20 mg via INTRAVENOUS
  Administered 2021-02-10: 30 mg via INTRAVENOUS

## 2021-02-10 MED ORDER — SODIUM CHLORIDE 0.9 % IV SOLN
INTRAVENOUS | Status: DC
  Administered 2021-02-10: 1000 mL via INTRAVENOUS

## 2021-02-10 MED ORDER — LIDOCAINE HCL (CARDIAC) PF 100 MG/5ML IV SOSY
PREFILLED_SYRINGE | INTRAVENOUS | Status: DC | PRN
Start: 2021-02-10 — End: 2021-02-10
  Administered 2021-02-10: 30 mg via INTRAVENOUS

## 2021-02-10 MED ORDER — PROPOFOL 500 MG/50ML IV EMUL
INTRAVENOUS | Status: DC | PRN
  Administered 2021-02-10: 110 ug/kg/min via INTRAVENOUS

## 2021-02-10 MED ORDER — LIDOCAINE HCL (PF) 2 % IJ SOLN
INTRAMUSCULAR | Status: AC
  Filled 2021-02-10: qty 5

## 2021-02-10 NOTE — Transfer of Care (Signed)
Immediate Anesthesia Transfer of Care Note  Patient: Holly Soto  Procedure(s) Performed: COLONOSCOPY WITH PROPOFOL  Patient Location: PACU and Endoscopy Unit  Anesthesia Type:General  Level of Consciousness: awake  Airway & Oxygen Therapy: Patient Spontanous Breathing  Post-op Assessment: Report given to RN  Post vital signs: stable  Last Vitals:  Vitals Value Taken Time  BP 100/65 02/10/21 0958  Temp 36.1 C 02/10/21 0958  Pulse 68 02/10/21 0958  Resp 16 02/10/21 0958  SpO2 99 % 02/10/21 0958  Vitals shown include unvalidated device data.  Last Pain:  Vitals:   02/10/21 0958  TempSrc: Temporal  PainSc: 0-No pain         Complications: No notable events documented.

## 2021-02-10 NOTE — Op Note (Signed)
[facility] Gastroenterology Patient Name: Holly Soto Procedure Date: 02/10/2021 9:17 AM MRN: [id number] Account #: [id number] Date of Birth: 1945/07/21 Admit Type: Outpatient Age: 75 Room: [facility] ENDO ROOM 3 Gender: Female Note Status: Finalized Procedure:             Colonoscopy Indications:           Surveillance: Personal history of adenomatous polyps                         on last colonoscopy 3 years ago Providers:             Andrey Farmer MD, MD Medicines:             Monitored Anesthesia Care Complications:         No immediate complications. Estimated blood loss:                         Minimal. Procedure:             Pre-Anesthesia Assessment:                        - Prior to the procedure, a History and Physical was                         performed, and patient medications and allergies were                         reviewed. The patient is competent. The risks and                         benefits of the procedure and the sedation options and                         risks were discussed with the patient. All questions                         were answered and informed consent was obtained.                         Patient identification and proposed procedure were                         verified by the physician, the nurse, the anesthetist                         and the technician in the endoscopy suite. Mental                         Status Examination: alert and oriented. Airway                         Examination: normal oropharyngeal airway and neck                         mobility. Respiratory Examination: clear to                         auscultation. CV Examination: normal. Prophylactic  Antibiotics: The patient does not require prophylactic                         antibiotics. Prior Anticoagulants: The patient has                         taken no previous anticoagulant or antiplatelet                         agents.  ASA Grade Assessment: II - A patient with mild                         systemic disease. After reviewing the risks and                         benefits, the patient was deemed in satisfactory                         condition to undergo the procedure. The anesthesia                         plan was to use monitored anesthesia care (MAC).                         Immediately prior to administration of medications,                         the patient was re-assessed for adequacy to receive                         sedatives. The heart rate, respiratory rate, oxygen                         saturations, blood pressure, adequacy of pulmonary                         ventilation, and response to care were monitored                         throughout the procedure. The physical status of the                         patient was re-assessed after the procedure.                        After obtaining informed consent, the colonoscope was                         passed under direct vision. Throughout the procedure,                         the patient's blood pressure, pulse, and oxygen                         saturations were monitored continuously. The                         Colonoscope was introduced through the anus and  advanced to the the cecum, identified by appendiceal                         orifice and ileocecal valve. The colonoscopy was                         performed without difficulty. The patient tolerated                         the procedure well. The quality of the bowel                         preparation was good. Findings:      The perianal and digital rectal examinations were normal.      A 2 mm polyp was found in the ascending colon. The polyp was sessile.       The polyp was removed with a cold snare. Resection was complete, but the       polyp tissue was not retrieved. Estimated blood loss was minimal.      Two sessile polyps were found in the transverse  colon. The polyps were 2       mm in size. These polyps were removed with a cold snare. Resection and       retrieval were complete. Estimated blood loss was minimal.      A 2 mm polyp was found in the sigmoid colon. The polyp was sessile. The       polyp was removed with a cold snare. Resection and retrieval were       complete. Estimated blood loss was minimal.      A few small-mouthed diverticula were found in the sigmoid colon.      Internal hemorrhoids were found during retroflexion. The hemorrhoids       were Grade I (internal hemorrhoids that do not prolapse).      The exam was otherwise without abnormality on direct and retroflexion       views. Impression:            - One 2 mm polyp in the ascending colon, removed with                         a cold snare. Complete resection. Polyp tissue not                         retrieved.                        - Two 2 mm polyps in the transverse colon, removed                         with a cold snare. Resected and retrieved.                        - One 2 mm polyp in the sigmoid colon, removed with a                         cold snare. Resected and retrieved.                        - Diverticulosis in the sigmoid colon.                        -  Internal hemorrhoids.                        - The examination was otherwise normal on direct and                         retroflexion views. Recommendation:        - Discharge patient to home.                        - Resume previous diet.                        - Continue present medications.                        - Await pathology results.                        - Repeat colonoscopy for surveillance based on                         pathology results.                        - Return to referring physician as previously                         scheduled. Procedure Code(s):     --- Professional ---                        [id number], Colonoscopy, flexible; with removal of                          tumor(s), polyp(s), or other lesion(s) by snare                         technique Diagnosis Code(s):     --- Professional ---                        K63.5, Polyp of colon                        Z86.010, Personal history of colonic polyps                        K64.0, First degree hemorrhoids                        K57.30, Diverticulosis of large intestine without                         perforation or abscess without bleeding CPT copyright 2019 [employer]. All rights reserved. The codes documented in this report are preliminary and upon coder review may  be revised to meet current compliance requirements. Andrey Farmer MD, MD 02/10/2021 9:58:39 AM Number of Addenda: 0 Note Initiated On: 02/10/2021 9:17 AM Scope Withdrawal Time: 0 hours 15 minutes 39 seconds  Total Procedure Duration: 0 hours 24 minutes 51 seconds  Estimated Blood Loss:  Estimated blood loss was minimal.      [facility]

## 2021-02-10 NOTE — Anesthesia Preprocedure Evaluation (Signed)
Anesthesia Evaluation  Patient identified by MRN, date of birth, ID band Patient awake    Reviewed: Allergy & Precautions, NPO status , Patient's Chart, lab work & pertinent test results  History of Anesthesia Complications Negative for: history of anesthetic complications  Airway Mallampati: II  TM Distance: >3 FB Neck ROM: Full    Dental no notable dental hx. (+) Teeth Intact   Pulmonary neg pulmonary ROS, neg sleep apnea, neg COPD, Patient abstained from smoking.Not current smoker,  Childhood asthma   Pulmonary exam normal breath sounds clear to auscultation       Cardiovascular Exercise Tolerance: Good METS(-) hypertension(-) CAD, (-) Past MI and (-) CHF negative cardio ROS  (-) dysrhythmias (-) Valvular Problems/Murmurs Rhythm:Regular Rate:Normal - Systolic murmurs    Neuro/Psych neg Seizures negative neurological ROS  negative psych ROS   GI/Hepatic Neg liver ROS, neg GERD  ,  Endo/Other  neg diabetes  Renal/GU negative Renal ROS     Musculoskeletal   Abdominal   Peds  Hematology  (+) anemia ,   Anesthesia Other Findings Past Medical History: No date: Anemia     Comment:  Iron Deficiency No date: Asthma No date: Cancer ([place])     Comment:  skin ca on back and chest No date: Colon adenomas No date: Hyperlipidemia No date: IDA (iron deficiency anemia) No date: Spondylosis  Reproductive/Obstetrics                             Anesthesia Physical  Anesthesia Plan  ASA: 2  Anesthesia Plan: General   Post-op Pain Management:    Induction: Intravenous  PONV Risk Score and Plan: 3 and TIVA and Propofol infusion  Airway Management Planned: Simple Face Mask and Natural Airway  Additional Equipment: None  Intra-op Plan:   Post-operative Plan:   Informed Consent: I have reviewed the patients History and Physical, chart, labs and discussed the procedure including the  risks, benefits and alternatives for the proposed anesthesia with the patient or authorized representative who has indicated his/her understanding and acceptance.     Dental advisory given  Plan Discussed with: CRNA and Surgeon  Anesthesia Plan Comments: (Discussed risks of anesthesia with patient, including possibility of difficulty with spontaneous ventilation under anesthesia necessitating airway intervention, PONV, and rare risks such as cardiac or respiratory or neurological events, and allergic reactions. Patient understands.)        Anesthesia Quick Evaluation

## 2021-02-10 NOTE — Interval H&P Note (Signed)
History and Physical Interval Note:  02/10/2021 9:23 AM  Holly Soto  has presented today for surgery, with the diagnosis of history of adenomatous polyp of colon.  The various methods of treatment have been discussed with the patient and family. After consideration of risks, benefits and other options for treatment, the patient has consented to  Procedure(s): COLONOSCOPY WITH PROPOFOL (N/A) as a surgical intervention.  The patient's history has been reviewed, patient examined, no change in status, stable for surgery.  I have reviewed the patient's chart and labs.  Questions were answered to the patient's satisfaction.     Lesly Rubenstein  Ok to proceed with colonoscopy

## 2021-02-10 NOTE — H&P (Signed)
Outpatient short stay form Pre-procedure 02/10/2021 9:18 AM Holly Miyamoto MD, MPH  Primary Physician: Dr. 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 No known family history of GI malignancies. No blood thinners. Hx of tubal ligation.    Current Facility-Administered Medications:    0.9 %  sodium chloride infusion, , Intravenous, Continuous, Beverely Suen, Hilton Cork, MD, Last Rate: 20 mL/hr at 02/10/21 0840, 1,000 mL at 02/10/21 0840  Medications Prior to Admission  Medication Sig Dispense Refill Last Dose   aspirin EC 81 MG tablet Take 81 mg by mouth daily.   Past Week   atorvastatin (LIPITOR) 10 MG tablet Take 10 mg by mouth daily.   Past Week   Calcium Carb-Cholecalciferol 600-800 MG-UNIT TABS Take by mouth.   Past Week   cholecalciferol (VITAMIN D) 1000 units tablet Take 1,000 Units by mouth daily.   Past Week   Multiple Vitamin (MULTIVITAMIN) capsule Take 1 capsule by mouth daily.   Past Week   Multiple Vitamins-Minerals (PRESERVISION AREDS 2+MULTI VIT PO) Take by mouth.   Past Week     Allergies  Allergen Reactions   Keflex [Cephalexin] Hives   Sulfa Antibiotics Rash     Past Medical History:  Diagnosis Date   Anemia    Iron Deficiency   Asthma    Cancer ([place])    skin ca on back and chest   Colon adenomas    Hyperlipidemia    IDA (iron deficiency anemia)    Spondylosis     Review of systems:  Otherwise negative.    Physical Exam  Gen: Alert, oriented. Appears stated age.  HEENT: PERRLA. Lungs: No respiratory distress CV: RRR Abd: soft, benign, no masses Ext: No edema.    Planned procedures: Proceed with colonoscopy. The patient understands the nature of the planned procedure, indications, risks, alternatives and potential complications including but not limited to bleeding, infection, perforation, damage to internal organs and possible oversedation/side  effects from anesthesia. The patient agrees and gives consent to proceed.  Please refer to procedure notes for findings, recommendations and patient disposition/instructions.     Holly Miyamoto MD, MPH Gastroenterology 02/10/2021  9:18 AM

## 2021-02-10 NOTE — Anesthesia Postprocedure Evaluation (Signed)
Anesthesia Post Note  Patient: Holly Soto  Procedure(s) Performed: COLONOSCOPY WITH PROPOFOL  Patient location during evaluation: Endoscopy Anesthesia Type: General Level of consciousness: awake and alert Pain management: pain level controlled Vital Signs Assessment: post-procedure vital signs reviewed and stable Respiratory status: spontaneous breathing, nonlabored ventilation, respiratory function stable and patient connected to nasal cannula oxygen Cardiovascular status: blood pressure returned to baseline and stable Postop Assessment: no apparent nausea or vomiting Anesthetic complications: no   No notable events documented.   Last Vitals:  Vitals:   02/10/21 1018 02/10/21 1021  BP:  (!) 150/54  Pulse: 66 63  Resp: 16 11  Temp:    SpO2: 100% 99%    Last Pain:  Vitals:   02/10/21 1018  TempSrc:   PainSc: 0-No pain                 Arita Miss

## 2021-02-13 ENCOUNTER — Encounter: Payer: Self-pay | Admitting: Gastroenterology

## 2021-02-13 LAB — SURGICAL PATHOLOGY

## 2021-08-24 ENCOUNTER — Other Ambulatory Visit: Payer: Self-pay

## 2021-08-25 ENCOUNTER — Other Ambulatory Visit: Payer: Self-pay | Admitting: Pediatrics

## 2021-08-25 DIAGNOSIS — Z1231 Encounter for screening mammogram for malignant neoplasm of breast: Secondary | ICD-10-CM

## 2021-10-24 ENCOUNTER — Ambulatory Visit
Admission: RE | Admit: 2021-10-24 | Discharge: 2021-10-24 | Disposition: A | Discharge status: Home / Self Care | Payer: Medicare PPO | Source: Ambulatory Visit | Attending: Pediatrics | Admitting: Pediatrics

## 2021-10-24 DIAGNOSIS — Z1231 Encounter for screening mammogram for malignant neoplasm of breast: Secondary | ICD-10-CM | POA: Insufficient documentation

## 2022-09-25 ENCOUNTER — Other Ambulatory Visit: Payer: Self-pay | Admitting: Pediatrics

## 2022-09-25 DIAGNOSIS — Z1231 Encounter for screening mammogram for malignant neoplasm of breast: Secondary | ICD-10-CM

## 2022-10-29 ENCOUNTER — Ambulatory Visit: Payer: Medicare PPO

## 2022-11-14 ENCOUNTER — Ambulatory Visit
Admission: RE | Admit: 2022-11-14 | Discharge: 2022-11-14 | Disposition: A | Discharge status: Home / Self Care | Payer: Medicare PPO | Source: Ambulatory Visit | Attending: Pediatrics | Admitting: Pediatrics

## 2022-11-14 DIAGNOSIS — Z1231 Encounter for screening mammogram for malignant neoplasm of breast: Secondary | ICD-10-CM | POA: Insufficient documentation

## 2023-10-01 ENCOUNTER — Other Ambulatory Visit: Payer: Self-pay | Admitting: Pediatrics

## 2023-10-01 DIAGNOSIS — Z1231 Encounter for screening mammogram for malignant neoplasm of breast: Secondary | ICD-10-CM

## 2023-11-19 ENCOUNTER — Ambulatory Visit
Admission: RE | Admit: 2023-11-19 | Discharge: 2023-11-19 | Disposition: A | Discharge status: Home / Self Care | Source: Ambulatory Visit | Attending: Pediatrics | Admitting: Pediatrics

## 2023-11-19 DIAGNOSIS — Z1231 Encounter for screening mammogram for malignant neoplasm of breast: Secondary | ICD-10-CM | POA: Insufficient documentation
